# Patient Record
Sex: Male | Born: 1965 | Race: Black or African American | Hispanic: No | Marital: Single | Smoking: Current every day smoker
Health system: Southern US, Community
[De-identification: ages and names within clinical notes are randomized; demographics above are authoritative.]

---

## 2014-08-13 ENCOUNTER — Emergency Department (HOSPITAL_BASED_OUTPATIENT_CLINIC_OR_DEPARTMENT_OTHER)
Admission: EM | Admit: 2014-08-13 | Discharge: 2014-08-13 | Disposition: A | Discharge status: Home / Self Care | Payer: PRIVATE HEALTH INSURANCE | Attending: Emergency Medicine | Admitting: Emergency Medicine

## 2014-08-13 ENCOUNTER — Encounter (HOSPITAL_BASED_OUTPATIENT_CLINIC_OR_DEPARTMENT_OTHER): Payer: Self-pay

## 2014-08-13 ENCOUNTER — Emergency Department (HOSPITAL_BASED_OUTPATIENT_CLINIC_OR_DEPARTMENT_OTHER): Payer: PRIVATE HEALTH INSURANCE

## 2014-08-13 DIAGNOSIS — Y9289 Other specified places as the place of occurrence of the external cause: Secondary | ICD-10-CM | POA: Insufficient documentation

## 2014-08-13 DIAGNOSIS — Y9389 Activity, other specified: Secondary | ICD-10-CM | POA: Insufficient documentation

## 2014-08-13 DIAGNOSIS — S6991XA Unspecified injury of right wrist, hand and finger(s), initial encounter: Secondary | ICD-10-CM | POA: Diagnosis present

## 2014-08-13 DIAGNOSIS — S60221A Contusion of right hand, initial encounter: Secondary | ICD-10-CM

## 2014-08-13 DIAGNOSIS — Y998 Other external cause status: Secondary | ICD-10-CM | POA: Diagnosis not present

## 2014-08-13 DIAGNOSIS — Z720 Tobacco use: Secondary | ICD-10-CM | POA: Insufficient documentation

## 2014-08-13 DIAGNOSIS — W230XXA Caught, crushed, jammed, or pinched between moving objects, initial encounter: Secondary | ICD-10-CM | POA: Diagnosis not present

## 2014-08-13 DIAGNOSIS — T1490XA Injury, unspecified, initial encounter: Secondary | ICD-10-CM

## 2014-08-13 MED ORDER — TRAMADOL HCL 50 MG PO TABS: 50.0000 mg | ORAL_TABLET | Freq: Four times a day (QID) | ORAL | Status: AC | PRN

## 2014-08-13 NOTE — Discharge Instructions (Signed)
Contusion °A contusion is a deep bruise. Contusions happen when an injury causes bleeding under the skin. Signs of bruising include pain, puffiness (swelling), and discolored skin. The contusion may turn blue, purple, or yellow. °HOME CARE  °· Put ice on the injured area. °¨ Put ice in a plastic bag. °¨ Place a towel between your skin and the bag. °¨ Leave the ice on for 15-20 minutes, 03-04 times a day. °· Only take medicine as told by your doctor. °· Rest the injured area. °· If possible, raise (elevate) the injured area to lessen puffiness. °GET HELP RIGHT AWAY IF:  °· You have more bruising or puffiness. °· You have pain that is getting worse. °· Your puffiness or pain is not helped by medicine. °MAKE SURE YOU:  °· Understand these instructions. °· Will watch your condition. °· Will get help right away if you are not doing well or get worse. °Document Released: 01/16/2008 Document Revised: 10/22/2011 Document Reviewed: 06/04/2011 °[employer]® Patient Information ©2015 [employer]. This information is not intended to replace advice given to you by your health care provider. Make sure you discuss any questions you have with your health care provider. ° °

## 2014-08-13 NOTE — ED Provider Notes (Signed)
CSN: [id number]     Arrival date & time 08/13/14  1205 History   First MD Initiated Contact with Patient 08/13/14 1218     Chief Complaint  Patient presents with  . Hand Injury     (Consider location/radiation/quality/duration/timing/severity/associated sxs/prior Treatment) HPI Comments: Pt c/o right hand pain after slamming in the door in window. Denies numbness or weakness. Hasn't tried anything for pain.has full rom. No previous injury.  The history is provided by the patient. No language interpreter was used.    History reviewed. No pertinent past medical history. No past surgical history on file. No family history on file. History  Substance Use Topics  . Smoking status: Current Every Day Smoker  . Smokeless tobacco: Not on file  . Alcohol Use: No    Review of Systems  All other systems reviewed and are negative.     Allergies  Review of patient's allergies indicates no known allergies.  Home Medications   Prior to Admission medications   Not on File   BP 128/85 mmHg  Pulse 96  Temp(Src) 98.5 F (36.9 C) (Oral)  Resp 16  Ht  (1.676 m)  Wt 153 lb (69.4 kg)  BMI 24.71 kg/m2  SpO2 99% Physical Exam  Constitutional: He is oriented to person, place, and time. He appears well-developed and well-nourished.  Cardiovascular: Normal rate and regular rhythm.   Pulmonary/Chest: Effort normal.  Musculoskeletal:  Tender and swelling along the fifth metatarsal  Neurological: He is alert and oriented to person, place, and time.  Skin: Skin is warm and dry.  Nursing note and vitals reviewed.   ED Course  Procedures (including critical care time) Labs Review Labs Reviewed - No data to display  Imaging Review Dg Hand Complete Right  08/13/2014   CLINICAL DATA:  Pain and swelling after blunt trauma last night when the hand was slammed in a car door.  EXAM: RIGHT HAND - COMPLETE 3+ VIEW  COMPARISON:  None.  FINDINGS: There is no evidence of fracture or  dislocation. There is no evidence of arthropathy or other focal bone abnormality. Soft tissues are unremarkable.  IMPRESSION: Normal exam.   Electronically Signed   By: Geanie Cooley M.D.   On: 08/13/2014 12:28     EKG Interpretation None      MDM   Final diagnoses:  Hand contusion, right, initial encounter    No acute bony abnormality noted. Pt has full rom. Will place in splint for comfort. Pt given referral to dr. Pearletha Forge. Will give Leim Fabry, NP 08/13/14 1248  Ethelda Chick, MD 08/13/14 1321

## 2014-08-13 NOTE — ED Notes (Signed)
Swelling and pain to right hand after slamming hand in dor.

## 2015-11-08 IMAGING — CR DG HAND COMPLETE 3+V*R*
3 series · 3 of 3 positions shown · non-contrast
Comparison: None.

CLINICAL DATA: Pain and swelling after blunt trauma last night when
the hand was slammed in a car door.

EXAM:
RIGHT HAND - COMPLETE 3+ VIEW

[x hand pa right]
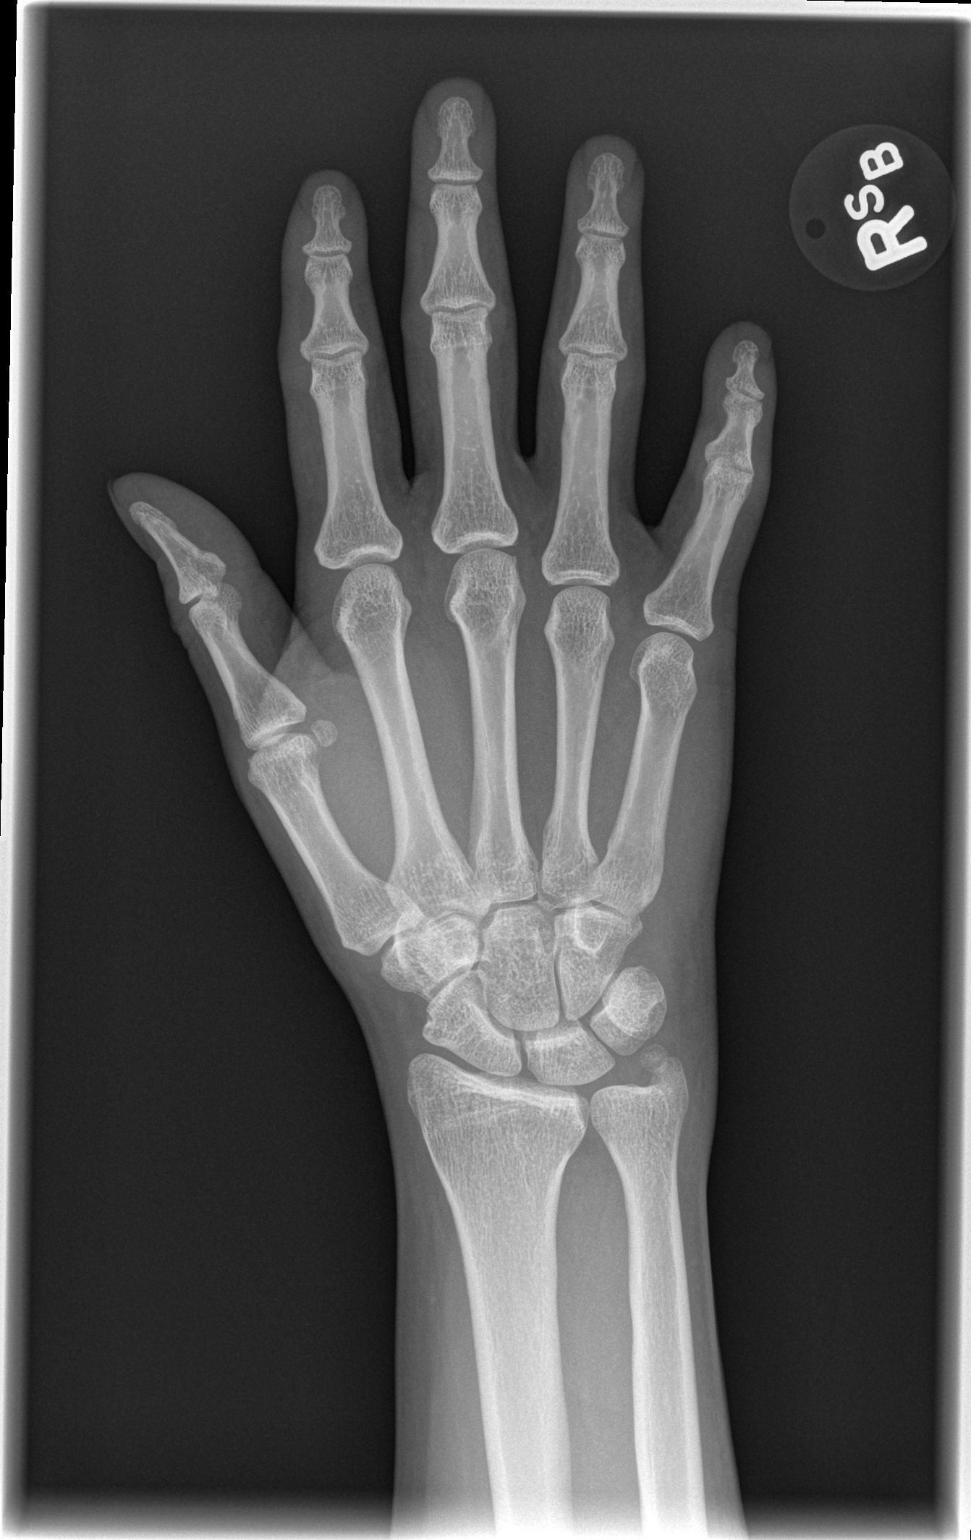

[x hand oblique right]
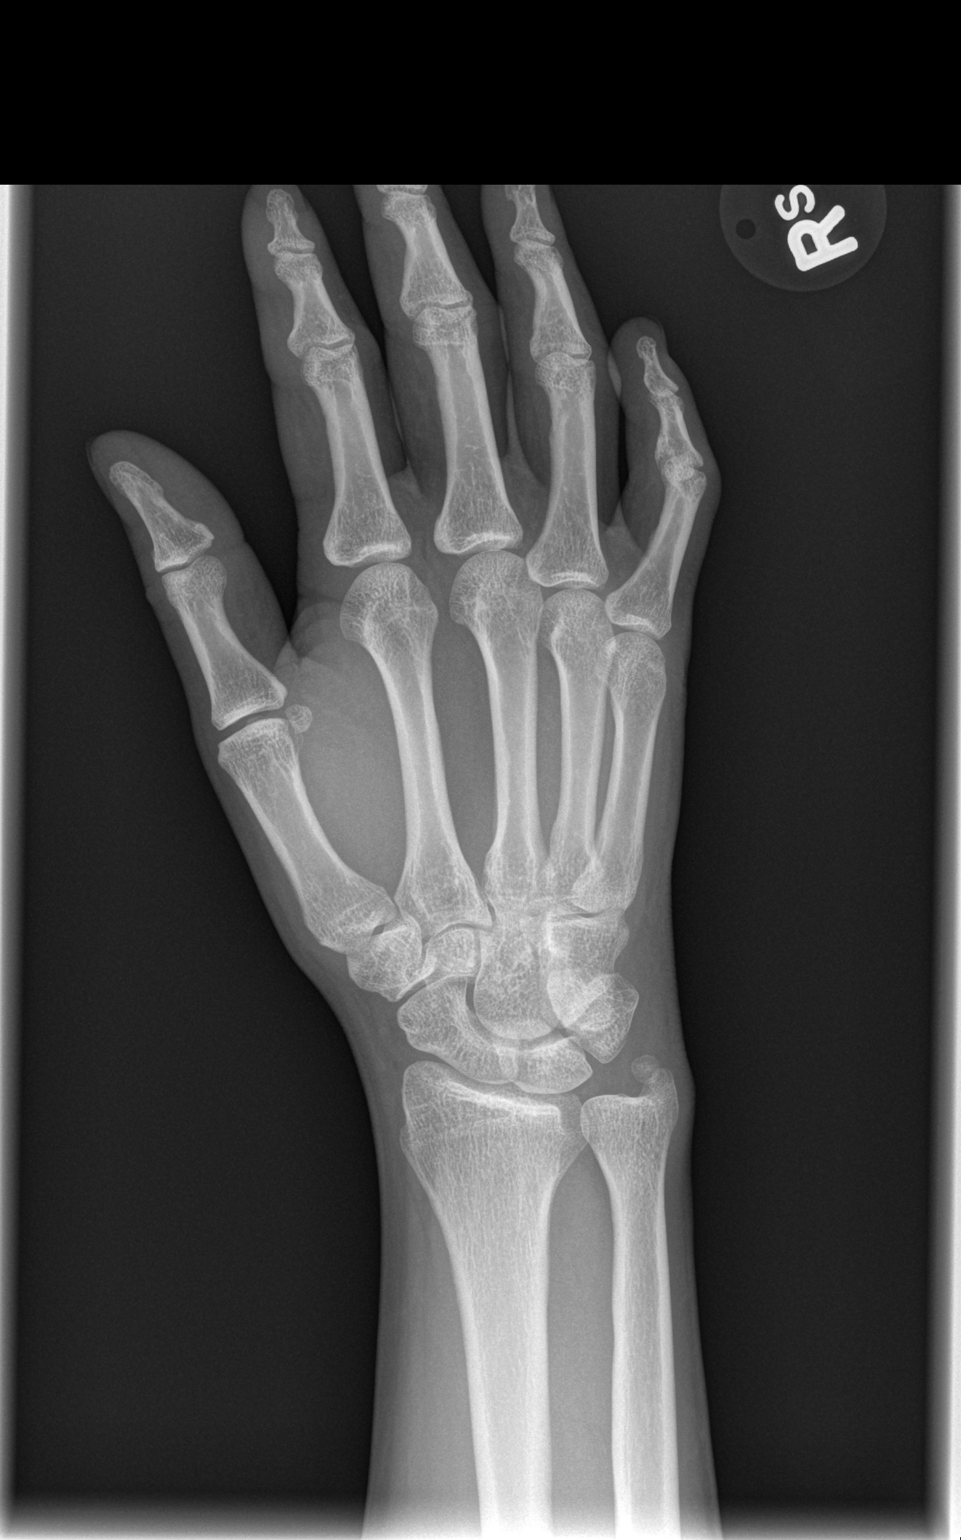

[x hand lat right]
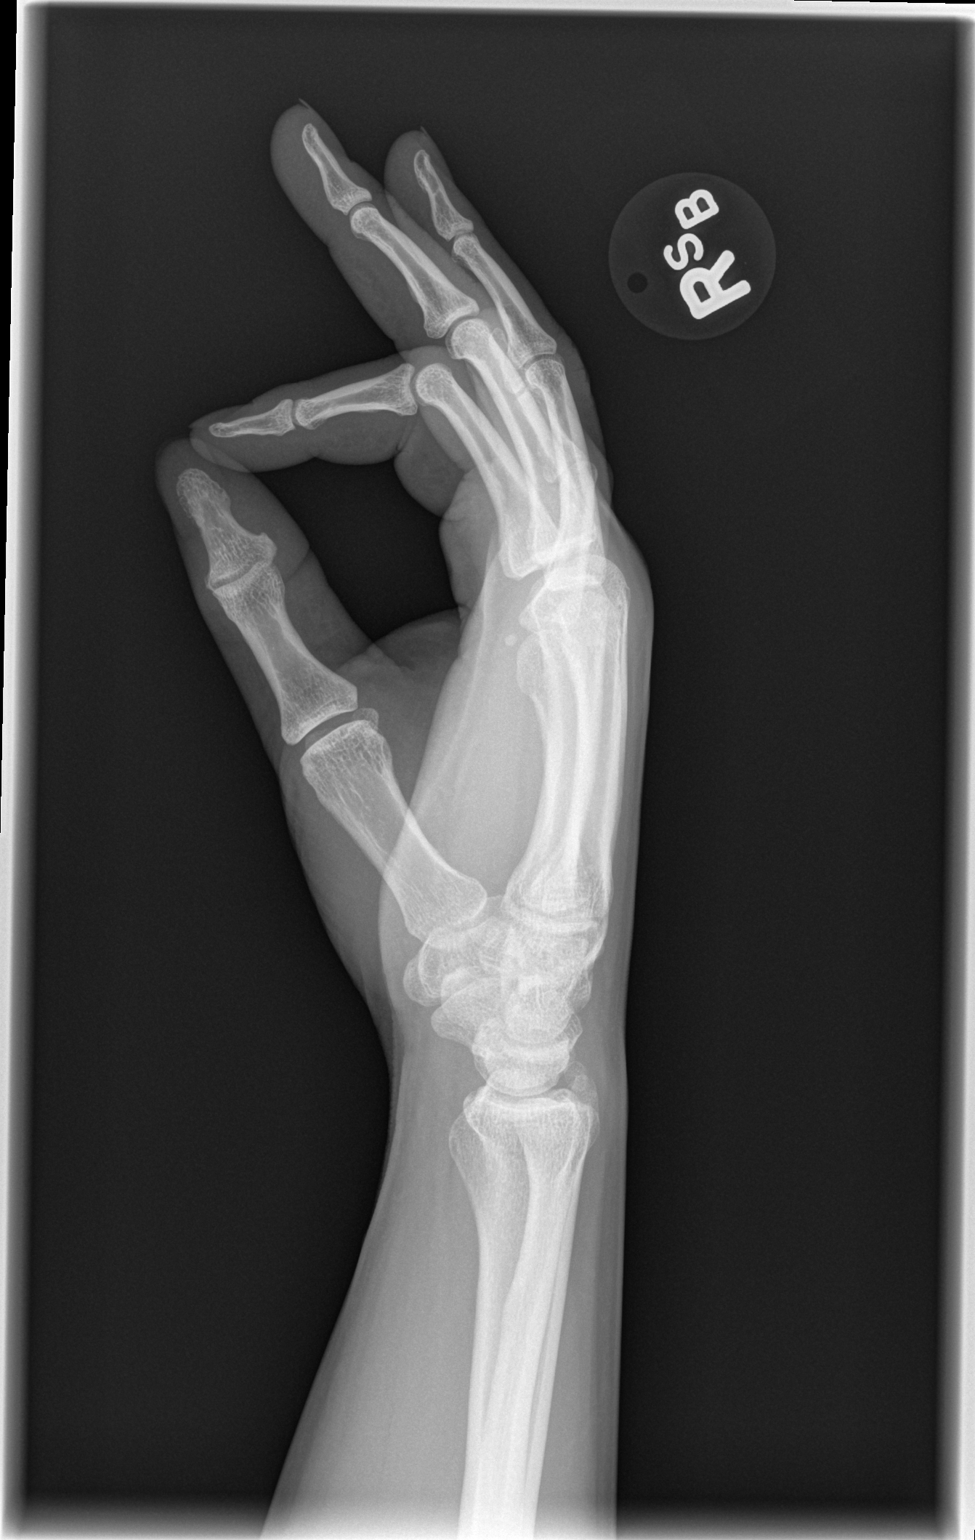

[3 of 3 positions shown; findings below may reference images not displayed]

FINDINGS: There is no evidence of fracture or dislocation. There is no
evidence of arthropathy or other focal bone abnormality. Soft
tissues are unremarkable.
IMPRESSION: Normal exam.

## 2023-05-21 ENCOUNTER — Inpatient Hospital Stay: Payer: PRIVATE HEALTH INSURANCE | Admitting: Medical Oncology

## 2023-05-21 ENCOUNTER — Inpatient Hospital Stay: Payer: PRIVATE HEALTH INSURANCE | Attending: Medical Oncology

## 2023-05-22 ENCOUNTER — Encounter: Payer: Self-pay | Admitting: Medical Oncology

## 2023-07-10 ENCOUNTER — Inpatient Hospital Stay: Payer: PRIVATE HEALTH INSURANCE | Admitting: Medical Oncology

## 2023-07-10 ENCOUNTER — Inpatient Hospital Stay: Payer: PRIVATE HEALTH INSURANCE | Attending: Medical Oncology

## 2023-08-22 ENCOUNTER — Other Ambulatory Visit: Payer: Self-pay | Admitting: Family

## 2023-08-22 DIAGNOSIS — D649 Anemia, unspecified: Secondary | ICD-10-CM

## 2023-08-22 DIAGNOSIS — D696 Thrombocytopenia, unspecified: Secondary | ICD-10-CM

## 2023-08-23 ENCOUNTER — Inpatient Hospital Stay: Payer: PRIVATE HEALTH INSURANCE | Admitting: Family

## 2023-08-23 ENCOUNTER — Inpatient Hospital Stay: Payer: PRIVATE HEALTH INSURANCE | Attending: Medical Oncology
